# Patient Record
Sex: Female | Born: 2000
Health system: Southern US, Community
[De-identification: ages and names within clinical notes are randomized; demographics above are authoritative.]

## PROBLEM LIST (undated history)

## (undated) DIAGNOSIS — L630 Alopecia (capitis) totalis: Secondary | ICD-10-CM

## (undated) DIAGNOSIS — J45909 Unspecified asthma, uncomplicated: Secondary | ICD-10-CM

---
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
## 2019-01-02 DIAGNOSIS — Z0000 Encounter for general adult medical examination without abnormal findings: Secondary | ICD-10-CM | POA: Diagnosis not present

## 2019-01-02 DIAGNOSIS — Z309 Encounter for contraceptive management, unspecified: Secondary | ICD-10-CM | POA: Diagnosis not present

## 2019-01-02 DIAGNOSIS — Z01419 Encounter for gynecological examination (general) (routine) without abnormal findings: Secondary | ICD-10-CM | POA: Diagnosis not present

## 2019-02-05 DIAGNOSIS — F331 Major depressive disorder, recurrent, moderate: Secondary | ICD-10-CM | POA: Diagnosis not present

## 2019-05-13 DIAGNOSIS — Z03818 Encounter for observation for suspected exposure to other biological agents ruled out: Secondary | ICD-10-CM | POA: Diagnosis not present

## 2019-05-25 DIAGNOSIS — Z30430 Encounter for insertion of intrauterine contraceptive device: Secondary | ICD-10-CM | POA: Diagnosis not present

## 2019-07-17 DIAGNOSIS — M25532 Pain in left wrist: Secondary | ICD-10-CM | POA: Diagnosis not present

## 2019-08-31 DIAGNOSIS — F331 Major depressive disorder, recurrent, moderate: Secondary | ICD-10-CM | POA: Diagnosis not present

## 2019-08-31 DIAGNOSIS — F411 Generalized anxiety disorder: Secondary | ICD-10-CM | POA: Diagnosis not present

## 2019-08-31 DIAGNOSIS — Z6835 Body mass index (BMI) 35.0-35.9, adult: Secondary | ICD-10-CM | POA: Diagnosis not present

## 2019-08-31 DIAGNOSIS — E669 Obesity, unspecified: Secondary | ICD-10-CM | POA: Diagnosis not present

## 2019-08-31 DIAGNOSIS — R635 Abnormal weight gain: Secondary | ICD-10-CM | POA: Diagnosis not present

## 2019-09-11 DIAGNOSIS — Z3009 Encounter for other general counseling and advice on contraception: Secondary | ICD-10-CM | POA: Diagnosis not present

## 2019-09-11 DIAGNOSIS — Z30432 Encounter for removal of intrauterine contraceptive device: Secondary | ICD-10-CM | POA: Diagnosis not present
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
## 2020-01-22 DIAGNOSIS — Z13220 Encounter for screening for lipoid disorders: Secondary | ICD-10-CM | POA: Diagnosis not present

## 2020-01-22 DIAGNOSIS — Z131 Encounter for screening for diabetes mellitus: Secondary | ICD-10-CM | POA: Diagnosis not present

## 2020-03-09 ENCOUNTER — Telehealth: Payer: Self-pay | Admitting: *Deleted

## 2020-03-09 NOTE — Telephone Encounter (Signed)
Pt has been placed on the schedule for testing

## 2020-03-09 NOTE — Telephone Encounter (Signed)
I typically do not like to do allergen immunotherapy based off of a testing more than 20 years old.  Thus I would recommend she follow-up for a repeat testing at this time to make sure we have updated information on allergens for her immunotherapy.

## 2020-03-09 NOTE — Telephone Encounter (Signed)
Pt called wanting to get started on allergy injections, her last testing was 2018 and last office visit was 09/2017. Please advise if she needs updated testing.

## 2020-03-10 ENCOUNTER — Telehealth: Payer: Self-pay

## 2020-03-10 ENCOUNTER — Other Ambulatory Visit: Payer: Self-pay

## 2020-03-10 ENCOUNTER — Encounter: Payer: Self-pay | Admitting: Allergy

## 2020-03-10 ENCOUNTER — Ambulatory Visit (INDEPENDENT_AMBULATORY_CARE_PROVIDER_SITE_OTHER): Payer: BLUE CROSS/BLUE SHIELD | Admitting: Allergy

## 2020-03-10 VITALS — BP 112/74 | HR 81 | Temp 98.1°F | Resp 16 | Ht 67.5 in | Wt 224.2 lb

## 2020-03-10 DIAGNOSIS — H1013 Acute atopic conjunctivitis, bilateral: Secondary | ICD-10-CM

## 2020-03-10 DIAGNOSIS — J3089 Other allergic rhinitis: Secondary | ICD-10-CM

## 2020-03-10 DIAGNOSIS — J4520 Mild intermittent asthma, uncomplicated: Secondary | ICD-10-CM | POA: Diagnosis not present

## 2020-03-10 MED ORDER — IPRATROPIUM BROMIDE 0.06 % NA SOLN: 2.0000 | Freq: Two times a day (BID) | NASAL | 5 refills | Status: AC

## 2020-03-10 MED ORDER — EPINEPHRINE 0.3 MG/0.3ML IJ SOAJ: 0.3000 mg | INTRAMUSCULAR | 1 refills | Status: DC | PRN

## 2020-03-10 MED ORDER — CROMOLYN SODIUM 4 % OP SOLN: 1.0000 [drp] | Freq: Four times a day (QID) | OPHTHALMIC | 5 refills | Status: DC

## 2020-03-10 NOTE — Telephone Encounter (Signed)
Received a fax back from [facility] signed by the physician assuming responsibility for the allergy vials. Called and spoke with patient and advised that we received the paperwork and she did confirm that she will pick up her Blue vials and paperwork and take them with her after her first injection. Allergy paperwork has been labeled and placed in bulk scanning. If the [facility] needs to be reached the phone number is [phone], Fax [phone]. Address is [address], [place] [address]. Prior to mailing any future vials we will need to contact the [facility] to verify the correct place to mail them to ensure the vials get to the correct location.

## 2020-03-10 NOTE — Patient Instructions (Addendum)
  1. For nasal congestion with obstruction use OTC Afrin 2 sprays each nostril daily.  Do not use for more than 3-5 days at a time.  Let sit for about 5-10 minutes or until your able to breathe more freely.  You can then perform nasal saline rinse followed by use of nasal Atrovent 2 sprays each nostril.    2. Every day utilize the following medications:   A. Nasal Atrovent 2 sprays each nostril twice a day (can use up to 3-4 times a day as needed for nasal drainage/congestion)  B. montelukast 10 mg tablet 1 time per day  C. Allegra 180mg  daily  3. If needed:   A. have access to albuterol inhaler 2 puffs every 4-6 hours as needed for cough/wheeze/shortness of breath/chest tightness.  May use 15-20 minutes prior to activity.   Monitor frequency of use.    B. Cromolyn eyedrop 1-2 drops each eye up to 4 times a day in regular intervals  C. Have access to epipen on days of injections and follow emergency action plan in case of reaction  4. Continue avoidance measures for grasses, weeds, trees, dust mite, molds, cat, dog, mixed feathers, tobacco leaf.  Allergen immunotherapy discussed today including protocol, benefits and risk.  Informational handout provided.  You can schedule new start appointment.  We can arrange with [facility] in administering your injections there.   Follow-up 6 months or sooner if needed

## 2020-03-10 NOTE — Telephone Encounter (Signed)
Patient will be faxing back completed paperwork to get her vials transferred to [facility] center.

## 2020-03-10 NOTE — Progress Notes (Signed)
Follow-up Note  RE: Angela Stephens MRN: [id number] DOB: [birth year] Date of Office Visit: 03/10/2020   History of present illness: Angela Stephens is a 19 y.o. female presenting today for allergy testing.  She was last seen in the office on 10/17/2017 by myself.  She states she had a reaction to cat exposure on halloween where her eye became swollen shut.  She states this was the impetus to start allergen immunotherapy.  She states she wish she had've started immunotherapy while she was still in high school but didn't.   She states she lives in a old dorm and forgot to turn on her purifier recently and states the air was dry and she had some shortness of breath and used her albuterol.  She states prior to this was years since using albuterol.  States used albuterol more in highschool doing sports/activity.  She is taking allegra and singulair daily but stopped for testing today.  She is very congested today and can tell difference off medications.  She states the nasal sprays have not been effective including nasacort and flonase. She will use afrin periodically if very congested.   Review of systems: Review of Systems  Constitutional: Negative.   HENT:       See HPI  Eyes:       See HPI  Respiratory:       See HPI  Cardiovascular: Negative.   Gastrointestinal: Negative.   Musculoskeletal: Negative.   Skin: Negative.   Neurological: Negative.     All other systems negative unless noted above in HPI  Past medical/social/surgical/family history have been reviewed and are unchanged unless specifically indicated below.  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 albuterol (PROAIR HFA) 108 (90 Base) MCG/ACT inhaler USE 2 PUFFS EVERY 4 HOURS AS NEEDED FOR COUGH/WHEEZE. MAY USE 2 PUFFS 10-20 MINUTES PRIOR TO EXERCIS 17 Inhaler 1  . ascorbic acid (VITAMIN C) 500 MG tablet Take by  mouth.    . busPIRone (BUSPAR) 5 MG tablet Take 5 mg by mouth 3 (three) times daily.    . fexofenadine (ALLEGRA) 60 MG tablet Take by mouth.    . medroxyPROGESTERone (DEPO-PROVERA) 150 MG/ML injection INJECT 1 VIAL INTRAMUSCU STAT BRING TO OFFICE FOR ADMINISTRATION    . montelukast (SINGULAIR) 10 MG tablet Take one tablet each evening to prevent cough or wheeze. 34 tablet 5  . Multiple Vitamin (MULTIVITAMIN) capsule Take 1 capsule by mouth daily.    . Omega-3 1000 MG CAPS Take by mouth.     No current facility-administered medications for this visit.     Known medication allergies: No Known Allergies   Physical examination: Blood pressure 112/74, pulse 81, temperature 98.1 F (36.7 C), temperature source Temporal, resp. rate 16, height 5' 7.5" (1.715 m), weight 224 lb 3.2 oz (101.7 kg), SpO2 97 %.  General: Alert, interactive, in no acute distress. HEENT: PERRLA, TMs pearly gray, turbinates markedly edematous and pale with clear discharge, post-pharynx non erythematous. Neck: Supple without lymphadenopathy. Lungs: Clear to auscultation without wheezing, rhonchi or rales. {no increased work of breathing. CV: Normal S1, S2 without murmurs. Abdomen: Nondistended, nontender. Skin: Warm and dry, without lesions or rashes. Extremities:  No clubbing, cyanosis or edema. Neuro:   Grossly intact.  Diagnositics/Labs:  Spirometry: FEV1: 3.04L 96%, FVC: 3.65L 101%, ratio consistent with nonobstructive pattern  Allergy testing: environmental allergy skin prick testing is positive to grass  pollens, weed pollens, tree pollens, alternaria, dust mites, cat, mixed feathers, tobacco leaf.  Intradermal testing is positive to ragweed mix, mold mix 2 and 4, dog.  Allergy testing results were read and interpreted by provider, documented by clinical staff.   Assessment and plan: Allergic rhinitis with conjunctivitis Mild intermittent asthma    1. For nasal congestion with obstruction use OTC Afrin 2  sprays each nostril daily.  Do not use for more than 3-5 days at a time.  Let sit for about 5-10 minutes or until your able to breathe more freely.  You can then perform nasal saline rinse followed by use of nasal Atrovent 2 sprays each nostril.    2. Every day utilize the following medications:   A. Nasal Atrovent 2 sprays each nostril twice a day (can use up to 3-4 times a day as needed for nasal drainage/congestion)  B. montelukast 10 mg tablet 1 time per day  C. Allegra 180mg  daily  3. If needed:   A. have access to albuterol inhaler 2 puffs every 4-6 hours as needed for cough/wheeze/shortness of breath/chest tightness.  May use 15-20 minutes prior to activity.   Monitor frequency of use.    B. Cromolyn eyedrop 1-2 drops each eye up to 4 times a day in regular intervals  C. Have access to epipen on days of injections and follow emergency action plan in case of reaction  4. Continue avoidance measures for grasses, weeds, trees, dust mite, molds, cat, dog, mixed feathers, tobacco leaf.  Allergen immunotherapy discussed today including protocol, benefits and risk.  Informational handout provided.  You can schedule new start appointment.  We can arrange with [facility] in administering your injections there.   Follow-up 6 months or sooner if needed   I appreciate the opportunity to take part in Angela Stephens's care. Please do not hesitate to contact me with questions.  Sincerely,   , MD Allergy/Immunology [facility]

## 2020-03-15 NOTE — Addendum Note (Signed)
Addended by: Lorrin Mais on: 03/15/2020 02:43 PM   Modules accepted: Orders

## 2020-03-16 DIAGNOSIS — J3081 Allergic rhinitis due to animal (cat) (dog) hair and dander: Secondary | ICD-10-CM | POA: Diagnosis not present

## 2020-03-16 NOTE — Progress Notes (Signed)
VIALS EXP 03-16-21

## 2020-03-17 DIAGNOSIS — J3089 Other allergic rhinitis: Secondary | ICD-10-CM | POA: Diagnosis not present

## 2020-04-04 ENCOUNTER — Ambulatory Visit (INDEPENDENT_AMBULATORY_CARE_PROVIDER_SITE_OTHER): Payer: BLUE CROSS/BLUE SHIELD | Admitting: *Deleted

## 2020-04-04 ENCOUNTER — Other Ambulatory Visit: Payer: Self-pay

## 2020-04-04 DIAGNOSIS — J309 Allergic rhinitis, unspecified: Secondary | ICD-10-CM

## 2020-04-04 NOTE — Progress Notes (Signed)
Immunotherapy   Patient Details  Name: Angela Stephens MRN: [id number] Date of Birth: 06/13/2000  04/04/2020  [name] started injections for  Pollen-Pet, Mold-Mite Following schedule: B  Frequency:1 time per week Epi-Pen:Epi-Pen Available  Consent signed and patient instructions given. Patient started allergy injection today and received 0.67mL Pollen-Pet in the RUA and 0.20mL of Mold-Mite in the LUA. Patient waited 30 minutes in office and did not experience any issues.  Patient will be receiving injections at  [facility]. Phone number is [phone], Fax [phone]. Address is [address], [place] [address].  She stated that the [facility] will be closing for the christmas Holiday so she will continue her injections in our office. I did instruct the patient to call us when she is ready to come pick up the vials and we will get the paperwork ready for her. Patient verbalized understanding.   [name] 04/04/2020, 8:43 AM

## 2020-04-15 ENCOUNTER — Ambulatory Visit (INDEPENDENT_AMBULATORY_CARE_PROVIDER_SITE_OTHER): Payer: BLUE CROSS/BLUE SHIELD

## 2020-04-15 DIAGNOSIS — J309 Allergic rhinitis, unspecified: Secondary | ICD-10-CM | POA: Diagnosis not present

## 2020-05-06 DIAGNOSIS — Z03818 Encounter for observation for suspected exposure to other biological agents ruled out: Secondary | ICD-10-CM | POA: Diagnosis not present

## 2020-05-06 DIAGNOSIS — Z20822 Contact with and (suspected) exposure to covid-19: Secondary | ICD-10-CM | POA: Diagnosis not present

## 2020-05-23 ENCOUNTER — Ambulatory Visit (INDEPENDENT_AMBULATORY_CARE_PROVIDER_SITE_OTHER): Payer: BLUE CROSS/BLUE SHIELD

## 2020-05-23 DIAGNOSIS — J309 Allergic rhinitis, unspecified: Secondary | ICD-10-CM

## 2020-06-13 DIAGNOSIS — L219 Seborrheic dermatitis, unspecified: Secondary | ICD-10-CM | POA: Diagnosis not present

## 2020-06-13 DIAGNOSIS — L089 Local infection of the skin and subcutaneous tissue, unspecified: Secondary | ICD-10-CM | POA: Diagnosis not present

## 2020-06-13 DIAGNOSIS — F633 Trichotillomania: Secondary | ICD-10-CM | POA: Diagnosis not present

## 2020-07-18 DIAGNOSIS — Z30013 Encounter for initial prescription of injectable contraceptive: Secondary | ICD-10-CM | POA: Diagnosis not present

## 2020-07-18 DIAGNOSIS — F411 Generalized anxiety disorder: Secondary | ICD-10-CM | POA: Diagnosis not present

## 2020-07-18 DIAGNOSIS — F331 Major depressive disorder, recurrent, moderate: Secondary | ICD-10-CM | POA: Diagnosis not present

## 2020-07-18 DIAGNOSIS — Z3202 Encounter for pregnancy test, result negative: Secondary | ICD-10-CM | POA: Diagnosis not present

## 2020-09-08 ENCOUNTER — Ambulatory Visit (INDEPENDENT_AMBULATORY_CARE_PROVIDER_SITE_OTHER): Payer: BLUE CROSS/BLUE SHIELD | Admitting: Allergy

## 2020-09-08 ENCOUNTER — Other Ambulatory Visit: Payer: Self-pay

## 2020-09-08 ENCOUNTER — Ambulatory Visit: Payer: Self-pay | Admitting: *Deleted

## 2020-09-08 ENCOUNTER — Encounter: Payer: Self-pay | Admitting: Allergy

## 2020-09-08 VITALS — BP 110/70 | HR 92 | Temp 97.7°F | Resp 18 | Ht 69.0 in | Wt 222.6 lb

## 2020-09-08 DIAGNOSIS — J331 Polypoid sinus degeneration: Secondary | ICD-10-CM | POA: Diagnosis not present

## 2020-09-08 DIAGNOSIS — J3089 Other allergic rhinitis: Secondary | ICD-10-CM

## 2020-09-08 DIAGNOSIS — H1013 Acute atopic conjunctivitis, bilateral: Secondary | ICD-10-CM | POA: Diagnosis not present

## 2020-09-08 DIAGNOSIS — J309 Allergic rhinitis, unspecified: Secondary | ICD-10-CM

## 2020-09-08 DIAGNOSIS — J4520 Mild intermittent asthma, uncomplicated: Secondary | ICD-10-CM | POA: Diagnosis not present

## 2020-09-08 MED ORDER — XHANCE 93 MCG/ACT NA EXHU: 2.0000 | INHALANT_SUSPENSION | Freq: Two times a day (BID) | NASAL | 2 refills | Status: AC

## 2020-09-08 MED ORDER — LEVOCETIRIZINE DIHYDROCHLORIDE 5 MG PO TABS: 5.0000 mg | ORAL_TABLET | Freq: Every evening | ORAL | 3 refills | Status: DC

## 2020-09-08 MED ORDER — ALBUTEROL SULFATE HFA 108 (90 BASE) MCG/ACT IN AERS: INHALATION_SPRAY | RESPIRATORY_TRACT | 1 refills | Status: AC

## 2020-09-08 MED ORDER — MONTELUKAST SODIUM 10 MG PO TABS: ORAL_TABLET | ORAL | 5 refills | Status: DC

## 2020-09-08 NOTE — Patient Instructions (Addendum)
  1. For nasal congestion with obstruction use OTC Afrin 2 sprays each nostril daily.  Do not use for more than 3-5 days at a time.  Let sit for about 5-10 minutes or until your able to breathe more freely.  Once you can breathe better out of the nose then use Xhance 2 sprays twice a day.   Hold Atrovent while using Xhance.   Samples provided of Xhance.     2. Every day utilize the following medications:   A. Nasal Xhance 2 sprays each nostril twice a day  B. montelukast 10 mg tablet 1 time per day  C. Xyzal 5mg  daily (best to buy bulk at [facility])  3. If needed:   A. have access to albuterol inhaler 2 puffs every 4-6 hours as needed for cough/wheeze/shortness of breath/chest tightness.  May use 15-20 minutes prior to activity.   Monitor frequency of use.    B. Cromolyn eyedrop 1-2 drops each eye up to 4 times a day in regular intervals  C. Have access to epipen on days of injections and follow emergency action plan in case of reaction  4. Continue avoidance measures for grasses, weeds, trees, dust mite, molds, cat, dog, mixed feathers, tobacco leaf.   Continue Allergen immunotherapy per protocol.   Will see if another allergist in [facility] can provide your immunotherapy  Follow-up 4-6 months or sooner if needed

## 2020-09-08 NOTE — Progress Notes (Signed)
Follow-up Note  RE: Angela Stephens MRN: [id number] DOB: 01-04-2001 Date of Office Visit: 09/08/2020   History of present illness: Angela Stephens is a 20 y.o. female presenting today for follow-up of allergic rhinitis with conjunctivitis and asthma.  She was last seen in the office on 03/10/2020 by myself.  She has never had any health changes, surgeries or hospitalizations since her last visit. She is still very congested.  She states it is a constant for her.  She did use the Afrin for 5 days after the last visit however did not use any maintenance nasal sprays after use of the Afrin.  But she states she would get several hours of congestion relief before her nose stopped back up again.  She has Atrovent which she did use after she did the 5 days of Afrin.  She would like to be able to breathe through her nose.  She does take montelukast which is reasonable.  She also states the Allegra stopped working but she changed to Xyzal currently.  She has also tried Zyrtec and Claritin and states at some point they will stop working.  She did need to use the cromolyn eyedrops weeks ago as she states pollen got into her eye.  She has not needed to use her albuterol since the last visit.  She is on immunotherapy in buildup phase and is tolerating the injections well without any large local or systemic reactions.  She states she was going to her student health to do her injections but she was being charged for injections and based she stopped going there and reason for coming to our office to get them however our office is not convenient for her to come every week.  She reports she would like to receive her injections locally in [place] where she is in school.  Review of systems: Review of Systems  Constitutional: Negative.   HENT: Positive for congestion.   Eyes:       Itchy eye  Respiratory: Negative.   Cardiovascular: Negative.   Gastrointestinal: Negative.   Musculoskeletal: Negative.   Skin:  Negative.   Neurological: Negative.     All other systems negative unless noted above in HPI  Past medical/social/surgical/family history have been reviewed and are unchanged unless specifically indicated below.  No changes  Medication List: Current Outpatient Medications  Medication Sig Dispense Refill  . albuterol (PROAIR HFA) 108 (90 Base) MCG/ACT inhaler USE 2 PUFFS EVERY 4 HOURS AS NEEDED FOR COUGH/WHEEZE. MAY USE 2 PUFFS 10-20 MINUTES PRIOR TO EXERCIS 17 Inhaler 1  . ascorbic acid (VITAMIN C) 500 MG tablet Take by mouth.    . busPIRone (BUSPAR) 5 MG tablet Take 5 mg by mouth 3 (three) times daily.    . cromolyn (OPTICROM) 4 % ophthalmic solution Place 1 drop into both eyes 4 (four) times daily. 10 mL 5  . EPINEPHrine 0.3 mg/0.3 mL IJ SOAJ injection Inject 0.3 mg into the muscle as needed for anaphylaxis. As needed for life-threatening allergic reactions 2 each 1  . fexofenadine (ALLEGRA) 60 MG tablet Take by mouth.    Marland Kitchen ipratropium (ATROVENT) 0.06 % nasal spray Place 2 sprays into both nostrils 2 (two) times daily. 15 mL 5  . medroxyPROGESTERone (DEPO-PROVERA) 150 MG/ML injection INJECT 1 VIAL INTRAMUSCU STAT BRING TO OFFICE FOR ADMINISTRATION    . montelukast (SINGULAIR) 10 MG tablet Take one tablet each evening to prevent cough or wheeze. 34 tablet 5  . Multiple Vitamin (MULTIVITAMIN) capsule Take 1 capsule  by mouth daily.    . Omega-3 1000 MG CAPS Take by mouth.     No current facility-administered medications for this visit.     Known medication allergies: No Known Allergies   Physical examination: Blood pressure 110/70, pulse 92, temperature 97.7 F (36.5 C), temperature source Temporal, resp. rate 18, height 5\' 9"  (1.753 m), weight 222 lb 9.6 oz (101 kg), SpO2 97 %.  General: Alert, interactive, in no acute distress. HEENT: PERRLA, TMs pearly gray, turbinates markedly edematous and pale without discharge, boggy bumpy mucosa, post-pharynx non  erythematous. Neck: Supple without lymphadenopathy. Lungs: Clear to auscultation without wheezing, rhonchi or rales. {no increased work of breathing. CV: Normal S1, S2 without murmurs. Abdomen: Nondistended, nontender. Skin: Warm and dry, without lesions or rashes. Extremities:  No clubbing, cyanosis or edema. Neuro:   Grossly intact.  Diagnositics/Labs:  Spirometry: FEV1: 3.05L 91%, FVC: 3.8L 98%, ratio consistent with nonobstructive pattern   Assessment and plan: Allergic rhinitis with conjunctivitis with severe nasal obstruction Mild intermittent asthma-under good control    1. For nasal congestion with obstruction use OTC Afrin 2 sprays each nostril daily.  Do not use for more than 3-5 days at a time.  Let sit for about 5-10 minutes or until your able to breathe more freely.  Once you can breathe better out of the nose then use Xhance 2 sprays twice a day.   Hold Atrovent while using Xhance.   Samples provided of Xhance.     2. Every day utilize the following medications:   A. Nasal Xhance 2 sprays each nostril twice a day  B. montelukast 10 mg tablet 1 time per day  C. Xyzal 5mg  daily (best to buy bulk at [facility])  3. If needed:   A. have access to albuterol inhaler 2 puffs every 4-6 hours as needed for cough/wheeze/shortness of breath/chest tightness.  May use 15-20 minutes prior to activity.   Monitor frequency of use.    B. Cromolyn eyedrop 1-2 drops each eye up to 4 times a day in regular intervals  C. Have access to epipen on days of injections and follow emergency action plan in case of reaction  4. Continue avoidance measures for grasses, weeds, trees, dust mite, molds, cat, dog, mixed feathers, tobacco leaf.   Continue Allergen immunotherapy per protocol.   Will see if another allergist in can provide your immunotherapy  Follow-up 4-6 months or sooner if needed   I appreciate the opportunity to take part in Amrita's care. Please do not hesitate to contact me  with questions.  Sincerely,   [facility], MD Allergy/Immunology [facility]

## 2020-09-12 ENCOUNTER — Telehealth: Payer: Self-pay

## 2020-09-12 NOTE — Telephone Encounter (Signed)
-----   Message from [facility] Larose Hires, MD sent at 09/08/2020  6:59 PM EDT ----- Pt was getting allergy shots at [facility] but they were charging $7 per injection which is not feasible for her.   She is agreeable to getting injections from [facility] [place] location.  Can we arrange for her to continue her injections there?  Thanks.

## 2020-09-12 NOTE — Telephone Encounter (Signed)
Spoke with patient, informed her that she will need to establish care with [facility] in order to start receiving her allergy injections at their location. Patient will call once she has made her appointment with [facility], for our office to fax over the acknowledgment forms for them to sign.

## 2020-09-21 ENCOUNTER — Ambulatory Visit (INDEPENDENT_AMBULATORY_CARE_PROVIDER_SITE_OTHER): Payer: BLUE CROSS/BLUE SHIELD

## 2020-09-21 DIAGNOSIS — J309 Allergic rhinitis, unspecified: Secondary | ICD-10-CM | POA: Diagnosis not present

## 2020-10-14 DIAGNOSIS — Z113 Encounter for screening for infections with a predominantly sexual mode of transmission: Secondary | ICD-10-CM | POA: Diagnosis not present

## 2020-10-14 DIAGNOSIS — Z3042 Encounter for surveillance of injectable contraceptive: Secondary | ICD-10-CM | POA: Diagnosis not present

## 2020-10-14 DIAGNOSIS — F411 Generalized anxiety disorder: Secondary | ICD-10-CM | POA: Diagnosis not present

## 2020-10-14 DIAGNOSIS — Z0000 Encounter for general adult medical examination without abnormal findings: Secondary | ICD-10-CM | POA: Diagnosis not present

## 2020-11-03 DIAGNOSIS — M6740 Ganglion, unspecified site: Secondary | ICD-10-CM | POA: Diagnosis not present

## 2020-11-03 DIAGNOSIS — M67432 Ganglion, left wrist: Secondary | ICD-10-CM | POA: Diagnosis not present

## 2020-11-03 DIAGNOSIS — M25532 Pain in left wrist: Secondary | ICD-10-CM | POA: Diagnosis not present

## 2020-11-30 DIAGNOSIS — G8918 Other acute postprocedural pain: Secondary | ICD-10-CM | POA: Diagnosis not present

## 2020-11-30 DIAGNOSIS — M67432 Ganglion, left wrist: Secondary | ICD-10-CM | POA: Diagnosis not present

## 2020-12-06 DIAGNOSIS — M25532 Pain in left wrist: Secondary | ICD-10-CM | POA: Diagnosis not present

## 2020-12-06 DIAGNOSIS — M6740 Ganglion, unspecified site: Secondary | ICD-10-CM | POA: Diagnosis not present

## 2021-01-13 DIAGNOSIS — Z3042 Encounter for surveillance of injectable contraceptive: Secondary | ICD-10-CM | POA: Diagnosis not present

## 2021-02-09 ENCOUNTER — Encounter: Payer: Self-pay | Admitting: Allergy

## 2021-02-09 ENCOUNTER — Other Ambulatory Visit: Payer: Self-pay

## 2021-02-09 ENCOUNTER — Ambulatory Visit (INDEPENDENT_AMBULATORY_CARE_PROVIDER_SITE_OTHER): Payer: BLUE CROSS/BLUE SHIELD | Admitting: Allergy

## 2021-02-09 VITALS — BP 100/76 | HR 93 | Temp 97.6°F | Ht 69.0 in | Wt 244.0 lb

## 2021-02-09 DIAGNOSIS — J4520 Mild intermittent asthma, uncomplicated: Secondary | ICD-10-CM

## 2021-02-09 DIAGNOSIS — J331 Polypoid sinus degeneration: Secondary | ICD-10-CM

## 2021-02-09 DIAGNOSIS — J3089 Other allergic rhinitis: Secondary | ICD-10-CM

## 2021-02-09 DIAGNOSIS — H1013 Acute atopic conjunctivitis, bilateral: Secondary | ICD-10-CM

## 2021-02-09 MED ORDER — LEVOCETIRIZINE DIHYDROCHLORIDE 5 MG PO TABS: 5.0000 mg | ORAL_TABLET | Freq: Every evening | ORAL | 3 refills | Status: DC

## 2021-02-09 MED ORDER — MONTELUKAST SODIUM 10 MG PO TABS: ORAL_TABLET | ORAL | 5 refills | Status: AC

## 2021-02-09 MED ORDER — EPINEPHRINE 0.3 MG/0.3ML IJ SOAJ: 0.3000 mg | INTRAMUSCULAR | 1 refills | Status: AC | PRN

## 2021-02-09 MED ORDER — CROMOLYN SODIUM 4 % OP SOLN: 1.0000 [drp] | Freq: Four times a day (QID) | OPHTHALMIC | 5 refills | Status: AC

## 2021-02-09 NOTE — Progress Notes (Signed)
Follow-up Note  RE: Angela Stephens MRN: [id number] DOB: [birth year] Date of Office Visit: 02/09/2021   History of present illness: Angela Stephens is a 20 y.o. female presenting today for follow-up of allergic rhinitis with conjunctivitis and asthma.  She was last seen in the office on 09/08/2020 myself.  She states her nasal congestion is bad again however states not as bad as last visit.  After the last visit she did use Afrin followed by Timmothy Sours use and she states this worked very well in decreasing her nasal congestion.  She was able to discontinue medication when her symptoms were improved.  However nasal congestion has returned and she states she tried using Afrin followed by Timmothy Sours this time and she does not feel it is as effective.  She then ran out of [place] at the beginning of the month.  She does continue on Singulair and Xyzal daily.  She has not been getting her allergy shots as she states the allergy partner office in [place] and never got back with her in regards to her being able to receive her injections there.  She would like to get back on her immunotherapy.   She has not had any significant issues in regards to her asthma.  She has not had any need for albuterol use.  No nighttime awakenings.  No ED or urgent care visits or any systemic steroid needs.   Review of systems: Review of Systems  Constitutional: Negative.   HENT:         See HPI  Eyes: Negative.   Respiratory: Negative.    Cardiovascular: Negative.   Gastrointestinal: Negative.   Musculoskeletal: Negative.   Skin: Negative.   Neurological: Negative.    All other systems negative unless noted above in HPI  Past medical/social/surgical/family history have been reviewed and are unchanged unless specifically indicated below.  No changes  Medication List: Current Outpatient Medications  Medication Sig Dispense Refill   albuterol (PROAIR HFA) 108 (90 Base) MCG/ACT inhaler USE 2 PUFFS EVERY 4 HOURS AS NEEDED  FOR COUGH/WHEEZE. MAY USE 2 PUFFS 10-20 MINUTES PRIOR TO EXERCIS 18 g 1   ascorbic acid (VITAMIN C) 500 MG tablet Take by mouth.     busPIRone (BUSPAR) 5 MG tablet Take 5 mg by mouth 3 (three) times daily.     cromolyn (OPTICROM) 4 % ophthalmic solution Place 1 drop into both eyes 4 (four) times daily. 10 mL 5   EPINEPHrine 0.3 mg/0.3 mL IJ SOAJ injection Inject 0.3 mg into the muscle as needed for anaphylaxis. As needed for life-threatening allergic reactions 2 each 1   fexofenadine (ALLEGRA) 60 MG tablet Take by mouth.     Fluticasone Propionate (XHANCE) 93 MCG/ACT EXHU Place 2 sprays into the nose 2 (two) times daily. 32 mL 2   ipratropium (ATROVENT) 0.06 % nasal spray Place 2 sprays into both nostrils 2 (two) times daily. 15 mL 5   levocetirizine (XYZAL) 5 MG tablet Take 1 tablet (5 mg total) by mouth every evening. 30 tablet 3   medroxyPROGESTERone (DEPO-PROVERA) 150 MG/ML injection INJECT 1 VIAL INTRAMUSCU STAT BRING TO OFFICE FOR ADMINISTRATION     montelukast (SINGULAIR) 10 MG tablet Take one tablet each evening to prevent cough or wheeze. 34 tablet 5   Multiple Vitamin (MULTIVITAMIN) capsule Take 1 capsule by mouth daily.     Omega-3 1000 MG CAPS Take by mouth.     No current facility-administered medications for this visit.     Known medication allergies:  No Known Allergies   Physical examination: Blood pressure 100/76, pulse 93, temperature 97.6 F (36.4 C), height 5\' 9"  (1.753 m), weight 244 lb (110.7 kg), SpO2 98 %.  General: Alert, interactive, in no acute distress. HEENT: PERRLA, TMs pearly gray, turbinates markedly edematous and pale with clear discharge, post-pharynx non erythematous. Neck: Supple without lymphadenopathy. Lungs: Clear to auscultation without wheezing, rhonchi or rales. {no increased work of breathing. CV: Normal S1, S2 without murmurs. Abdomen: Nondistended, nontender. Skin: Warm and dry, without lesions or rashes. Extremities:  No clubbing, cyanosis  or edema. Neuro:   Grossly intact.  Diagnositics/Labs:  Spirometry: FEV1: 2.79L 83%, FVC: 3.35L 87%, ratio consistent with nonobstructive pattern  Assessment and plan: Allergic rhinitis with conjunctivitis Mild intermittent asthma    1. For nasal congestion with obstruction use OTC Afrin 2 sprays each nostril daily.  Do not use for more than 3-5 days at a time.  Let sit for about 5-10 minutes or until your able to breathe more freely.  Once you can breathe better out of the nose then use Xhance 2 sprays twice a day.   Hold Atrovent while using Xhance.   Samples provided of Xhance.     2. Every day utilize the following medications:   A. Nasal Xhance 2 sprays each nostril twice a day  B. montelukast 10 mg tablet 1 time per day  C. Xyzal 5mg  daily (best to buy bulk at [facility])  3. If needed:   A. have access to albuterol inhaler 2 puffs every 4-6 hours as needed for cough/wheeze/shortness of breath/chest tightness.  May use 15-20 minutes prior to activity.   Monitor frequency of use.    B. Cromolyn eyedrop 1-2 drops each eye up to 4 times a day in regular intervals  C. Have access to epipen on days of injections and follow emergency action plan in case of reaction  4. Continue avoidance measures for grasses, weeds, trees, dust mite, molds, cat, dog, mixed feathers, tobacco leaf.   Nee to resume allergen immunotherapy.   Will see if [facility] can provide your immunotherapy injections  Follow-up 4-6 months or sooner if needed    I appreciate the opportunity to take part in Angela Stephens's care. Please do not hesitate to contact me with questions.  Sincerely,   [facility], MD Allergy/Immunology [facility]

## 2021-02-09 NOTE — Patient Instructions (Signed)
  1. For nasal congestion with obstruction use OTC Afrin 2 sprays each nostril daily.  Do not use for more than 3-5 days at a time.  Let sit for about 5-10 minutes or until your able to breathe more freely.  Once you can breathe better out of the nose then use Xhance 2 sprays twice a day.   Hold Atrovent while using Xhance.   Samples provided of Xhance.     2. Every day utilize the following medications:   A. Nasal Xhance 2 sprays each nostril twice a day  B. montelukast 10 mg tablet 1 time per day  C. Xyzal 5mg  daily (best to buy bulk at [facility])  3. If needed:   A. have access to albuterol inhaler 2 puffs every 4-6 hours as needed for cough/wheeze/shortness of breath/chest tightness.  May use 15-20 minutes prior to activity.   Monitor frequency of use.    B. Cromolyn eyedrop 1-2 drops each eye up to 4 times a day in regular intervals  C. Have access to epipen on days of injections and follow emergency action plan in case of reaction  4. Continue avoidance measures for grasses, weeds, trees, dust mite, molds, cat, dog, mixed feathers, tobacco leaf.   Nee to resume allergen immunotherapy.   Will see if [facility] can provide your immunotherapy injections  Follow-up 4-6 months or sooner if needed

## 2021-02-24 ENCOUNTER — Telehealth: Payer: Self-pay

## 2021-02-24 NOTE — Telephone Encounter (Signed)
-----   Message from [facility] Larose Hires, MD sent at 02/10/2021  5:35 PM EDT ----- Please let patient know that the [facility] allergy office is on [address].  If this would be a potentially convenient location for her to get to we will need to send in a referral for a initial visit so that they can start providing her allergy shots there.  We will continue to make her vials she just wants a closer location to her college to continue her allergy shots.  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

## 2021-02-24 NOTE — Telephone Encounter (Signed)
I called and left a voicemail for the patient to discuss the following message.

## 2021-02-24 NOTE — Telephone Encounter (Signed)
Patient called back, read to patient Dr. Randell Patient message. Patient is requesting we send over a referral to [facility].     Best contact number: [phone]

## 2021-02-28 NOTE — Telephone Encounter (Signed)
Patient is scheduled to see Dr Elpidio Anis on 04/13/21 @ 1:30 200 [address] [place] [address] Phone: [phone] Fax: [phone]  I did notice the patients vials are expiring this month.  Did I need to make her an appt for her vials to be made or are we letting their office take over making them?  I have sent her recent office visits & allergy testing via fax.

## 2021-02-28 NOTE — Telephone Encounter (Signed)
Angela Stephens can you please make the patients vials from [facility] through [facility] and send them to [facility] please?

## 2021-03-01 DIAGNOSIS — J301 Allergic rhinitis due to pollen: Secondary | ICD-10-CM | POA: Diagnosis not present

## 2021-03-01 NOTE — Progress Notes (Signed)
VIALS MADE. EXP 03-01-22 

## 2021-03-02 DIAGNOSIS — J3089 Other allergic rhinitis: Secondary | ICD-10-CM | POA: Diagnosis not present

## 2021-03-07 NOTE — Telephone Encounter (Signed)
Called and left a voicemail asking for patient to return call. Wanted to discuss if she is still getting her allergy shots at her university or if she is waiting to transfer to the new Allergist. That way we can be sure where to send her new Blue vials.

## 2021-04-13 DIAGNOSIS — H1010 Acute atopic conjunctivitis, unspecified eye: Secondary | ICD-10-CM | POA: Diagnosis not present

## 2021-04-13 DIAGNOSIS — J309 Allergic rhinitis, unspecified: Secondary | ICD-10-CM | POA: Diagnosis not present

## 2021-04-14 DIAGNOSIS — D649 Anemia, unspecified: Secondary | ICD-10-CM | POA: Diagnosis not present

## 2021-04-14 DIAGNOSIS — F331 Major depressive disorder, recurrent, moderate: Secondary | ICD-10-CM | POA: Diagnosis not present

## 2021-04-14 DIAGNOSIS — F411 Generalized anxiety disorder: Secondary | ICD-10-CM | POA: Diagnosis not present

## 2021-04-14 DIAGNOSIS — Z3042 Encounter for surveillance of injectable contraceptive: Secondary | ICD-10-CM | POA: Diagnosis not present

## 2021-06-19 ENCOUNTER — Other Ambulatory Visit: Payer: Self-pay | Admitting: Allergy

## 2021-07-14 ENCOUNTER — Ambulatory Visit: Payer: BLUE CROSS/BLUE SHIELD | Admitting: Allergy

## 2022-06-08 ENCOUNTER — Other Ambulatory Visit: Payer: Self-pay
